# Patient Record
Sex: Male | Born: 1980
Health system: Southern US, Community
[De-identification: ages and names within clinical notes are randomized; demographics above are authoritative.]

---

## 2013-07-11 ENCOUNTER — Emergency Department (HOSPITAL_COMMUNITY): Payer: Worker's Compensation

## 2013-07-11 ENCOUNTER — Observation Stay (HOSPITAL_COMMUNITY)
Admission: EM | Admit: 2013-07-11 | Discharge: 2013-07-12 | Disposition: A | Discharge status: Home / Self Care | Payer: Worker's Compensation | Attending: General Surgery | Admitting: General Surgery

## 2013-07-11 ENCOUNTER — Encounter (HOSPITAL_COMMUNITY): Payer: Self-pay | Admitting: Emergency Medicine

## 2013-07-11 DIAGNOSIS — Z23 Encounter for immunization: Secondary | ICD-10-CM | POA: Insufficient documentation

## 2013-07-11 DIAGNOSIS — S060X1A Concussion with loss of consciousness of 30 minutes or less, initial encounter: Secondary | ICD-10-CM | POA: Diagnosis not present

## 2013-07-11 DIAGNOSIS — F17200 Nicotine dependence, unspecified, uncomplicated: Secondary | ICD-10-CM | POA: Insufficient documentation

## 2013-07-11 DIAGNOSIS — S060XAA Concussion with loss of consciousness status unknown, initial encounter: Secondary | ICD-10-CM

## 2013-07-11 DIAGNOSIS — S3282XA Multiple fractures of pelvis without disruption of pelvic ring, initial encounter for closed fracture: Secondary | ICD-10-CM | POA: Diagnosis not present

## 2013-07-11 DIAGNOSIS — S27329A Contusion of lung, unspecified, initial encounter: Secondary | ICD-10-CM | POA: Insufficient documentation

## 2013-07-11 DIAGNOSIS — Y92410 Unspecified street and highway as the place of occurrence of the external cause: Secondary | ICD-10-CM | POA: Insufficient documentation

## 2013-07-11 DIAGNOSIS — S329XXA Fracture of unspecified parts of lumbosacral spine and pelvis, initial encounter for closed fracture: Secondary | ICD-10-CM

## 2013-07-11 DIAGNOSIS — F1210 Cannabis abuse, uncomplicated: Secondary | ICD-10-CM | POA: Diagnosis not present

## 2013-07-11 DIAGNOSIS — S060X9A Concussion with loss of consciousness of unspecified duration, initial encounter: Secondary | ICD-10-CM

## 2013-07-11 LAB — COMPREHENSIVE METABOLIC PANEL
ALT: 68 U/L — AB (ref 0–53)
AST: 73 U/L — AB (ref 0–37)
Albumin: 3.8 g/dL (ref 3.5–5.2)
Alkaline Phosphatase: 63 U/L (ref 39–117)
Anion gap: 14 (ref 5–15)
BUN: 11 mg/dL (ref 6–23)
CALCIUM: 9.4 mg/dL (ref 8.4–10.5)
CO2: 22 meq/L (ref 19–32)
Chloride: 103 mEq/L (ref 96–112)
Creatinine, Ser: 1.01 mg/dL (ref 0.50–1.35)
GFR calc Af Amer: >90 mL/min (ref >90)
GFR calc non Af Amer: >90 mL/min (ref >90)
Glucose, Bld: 195 mg/dL — ABNORMAL HIGH (ref 70–99)
Potassium: 3.6 mEq/L — ABNORMAL LOW (ref 3.7–5.3)
SODIUM: 139 meq/L (ref 137–147)
TOTAL PROTEIN: 6.6 g/dL (ref 6.0–8.3)
Total Bilirubin: 0.4 mg/dL (ref 0.3–1.2)

## 2013-07-11 LAB — CDS SEROLOGY

## 2013-07-11 LAB — CBC
HEMATOCRIT: 41.8 % (ref 39.0–52.0)
Hemoglobin: 14.8 g/dL (ref 13.0–17.0)
MCH: 31.8 pg (ref 26.0–34.0)
MCHC: 35.4 g/dL (ref 30.0–36.0)
MCV: 89.7 fL (ref 78.0–100.0)
Platelets: 181 10*3/uL (ref 150–400)
RBC: 4.66 MIL/uL (ref 4.22–5.81)
RDW: 12.7 % (ref 11.5–15.5)
WBC: 23.6 10*3/uL — ABNORMAL HIGH (ref 4.0–10.5)

## 2013-07-11 LAB — PROTIME-INR
INR: 1.11 (ref 0.00–1.49)
Prothrombin Time: 14.3 seconds (ref 11.6–15.2)

## 2013-07-11 LAB — ETHANOL: Alcohol, Ethyl (B): <11 mg/dL (ref 0–11)

## 2013-07-11 LAB — SAMPLE TO BLOOD BANK

## 2013-07-11 MED ORDER — HYDROMORPHONE HCL PF 1 MG/ML IJ SOLN
1.0000 mg | Freq: Once | INTRAMUSCULAR | Status: AC
  Administered 2013-07-11: 1 mg via INTRAVENOUS

## 2013-07-11 MED ORDER — HYDROMORPHONE HCL PF 1 MG/ML IJ SOLN
INTRAMUSCULAR | Status: AC
  Filled 2013-07-11: qty 1

## 2013-07-11 MED ORDER — HYDROMORPHONE HCL PF 1 MG/ML IJ SOLN
1.0000 mg | Freq: Once | INTRAMUSCULAR | Status: AC
  Administered 2013-07-12: 1 mg via INTRAVENOUS
  Filled 2013-07-11: qty 1

## 2013-07-11 MED ORDER — ONDANSETRON HCL 4 MG/2ML IJ SOLN
4.0000 mg | Freq: Four times a day (QID) | INTRAMUSCULAR | Status: DC | PRN
  Administered 2013-07-12: 4 mg via INTRAVENOUS
  Filled 2013-07-11: qty 2

## 2013-07-11 MED ORDER — ACETAMINOPHEN 325 MG PO TABS: 650.0000 mg | ORAL_TABLET | ORAL | Status: DC | PRN

## 2013-07-11 MED ORDER — OXYCODONE HCL 5 MG PO TABS: 5.0000 mg | ORAL_TABLET | ORAL | Status: DC | PRN

## 2013-07-11 MED ORDER — IOHEXOL 300 MG/ML  SOLN
100.0000 mL | Freq: Once | INTRAMUSCULAR | Status: AC | PRN
  Administered 2013-07-11: 100 mL via INTRAVENOUS

## 2013-07-11 MED ORDER — HYDROMORPHONE HCL PF 1 MG/ML IJ SOLN
0.5000 mg | INTRAMUSCULAR | Status: DC | PRN
  Administered 2013-07-12: 1 mg via INTRAVENOUS
  Filled 2013-07-11: qty 1

## 2013-07-11 MED ORDER — OXYCODONE HCL 5 MG PO TABS: 10.0000 mg | ORAL_TABLET | ORAL | Status: DC | PRN

## 2013-07-11 MED ORDER — KCL IN DEXTROSE-NACL 20-5-0.45 MEQ/L-%-% IV SOLN
INTRAVENOUS | Status: DC
  Administered 2013-07-12: 03:00:00 via INTRAVENOUS
  Filled 2013-07-11: qty 1000

## 2013-07-11 MED ORDER — ONDANSETRON HCL 4 MG PO TABS: 4.0000 mg | ORAL_TABLET | Freq: Four times a day (QID) | ORAL | Status: DC | PRN

## 2013-07-11 MED ORDER — TETANUS-DIPHTH-ACELL PERTUSSIS 5-2.5-18.5 LF-MCG/0.5 IM SUSP
0.5000 mL | Freq: Once | INTRAMUSCULAR | Status: AC
  Administered 2013-07-11: 0.5 mL via INTRAMUSCULAR
  Filled 2013-07-11: qty 0.5
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
## 2013-07-12 DIAGNOSIS — S060XAA Concussion with loss of consciousness status unknown, initial encounter: Secondary | ICD-10-CM

## 2013-07-12 DIAGNOSIS — S060X9A Concussion with loss of consciousness of unspecified duration, initial encounter: Secondary | ICD-10-CM

## 2013-07-12 LAB — BASIC METABOLIC PANEL
Anion gap: 13 (ref 5–15)
BUN: 10 mg/dL (ref 6–23)
CALCIUM: 8.9 mg/dL (ref 8.4–10.5)
CO2: 24 mEq/L (ref 19–32)
Chloride: 101 mEq/L (ref 96–112)
Creatinine, Ser: 0.79 mg/dL (ref 0.50–1.35)
Glucose, Bld: 119 mg/dL — ABNORMAL HIGH (ref 70–99)
Potassium: 3.9 mEq/L (ref 3.7–5.3)
Sodium: 138 mEq/L (ref 137–147)

## 2013-07-12 LAB — URINALYSIS, ROUTINE W REFLEX MICROSCOPIC
BILIRUBIN URINE: NEGATIVE
Glucose, UA: 500 mg/dL — AB
Hgb urine dipstick: NEGATIVE
Ketones, ur: NEGATIVE mg/dL
Leukocytes, UA: NEGATIVE
NITRITE: NEGATIVE
Protein, ur: NEGATIVE mg/dL
Specific Gravity, Urine: 1.02 (ref 1.005–1.030)
UROBILINOGEN UA: 0.2 mg/dL (ref 0.0–1.0)
pH: 5.5 (ref 5.0–8.0)

## 2013-07-12 LAB — CBC
HCT: 40.9 % (ref 39.0–52.0)
Hemoglobin: 14.1 g/dL (ref 13.0–17.0)
MCH: 31.3 pg (ref 26.0–34.0)
MCHC: 34.5 g/dL (ref 30.0–36.0)
MCV: 90.7 fL (ref 78.0–100.0)
Platelets: 131 10*3/uL — ABNORMAL LOW (ref 150–400)
RBC: 4.51 MIL/uL (ref 4.22–5.81)
RDW: 12.8 % (ref 11.5–15.5)
WBC: 10.7 10*3/uL — ABNORMAL HIGH (ref 4.0–10.5)

## 2013-07-12 MED ORDER — NAPROXEN 500 MG PO TABS
500.0000 mg | ORAL_TABLET | Freq: Two times a day (BID) | ORAL | Status: DC
  Administered 2013-07-12: 500 mg via ORAL
  Filled 2013-07-12 (×3): qty 1

## 2013-07-12 MED ORDER — NAPROXEN 500 MG PO TABS: 500.0000 mg | ORAL_TABLET | Freq: Two times a day (BID) | ORAL | Status: AC

## 2013-07-12 MED ORDER — HYDROMORPHONE HCL PF 1 MG/ML IJ SOLN: 0.5000 mg | INTRAMUSCULAR | Status: DC | PRN

## 2013-07-12 MED ORDER — ENOXAPARIN SODIUM 30 MG/0.3ML ~~LOC~~ SOLN
30.0000 mg | Freq: Two times a day (BID) | SUBCUTANEOUS | Status: DC
  Administered 2013-07-12: 30 mg via SUBCUTANEOUS
  Filled 2013-07-12 (×2): qty 0.3

## 2013-07-12 MED ORDER — OXYCODONE HCL 5 MG PO TABS
5.0000 mg | ORAL_TABLET | ORAL | Status: DC | PRN
  Administered 2013-07-12: 15 mg via ORAL
  Filled 2013-07-12: qty 3

## 2013-07-12 MED ORDER — OXYCODONE-ACETAMINOPHEN 5-325 MG PO TABS: 1.0000 | ORAL_TABLET | ORAL | Status: AC | PRN

## 2013-07-12 NOTE — Progress Notes (Signed)
UR Completed Nadiah Corbit Graves-Bigelow, RN,BSN [phone]  
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
## 2013-07-12 NOTE — Progress Notes (Signed)
Appreciate ortho eval. Advance diet and begin therapies. Patient examined and I agree with the assessment and plan  Violeta GelinasBurke Mitsugi Schrader, MD, MPH, FACS Trauma: [phone] General Surgery: [phone]  07/12/2013 9:15 AM

## 2013-07-12 NOTE — Discharge Summary (Signed)
Brindle Leyba, MD, MPH, FACS Trauma: [phone] General Surgery: [phone]  
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
## 2013-07-12 NOTE — Evaluation (Signed)
I have read and agree with this note.   Time in/out:1442-1505 Total time: 23 minutes (ev, 1SC)  Ignacia Palmaathy Seymore Brodowski, OTR/L [phone]
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
## 2013-07-12 NOTE — Progress Notes (Addendum)
Physical medicine and rehabilitation consult requested with chart reviewed. Spoke at length with patient and mother at bedside. Patient and mother both requesting discharge to home with home health therapies. Mother is taking FMLA to provide assistance. Hold on formal rehabilitation consult at this time with request discharge to home   Agree with above.  Ranelle OysterZachary T. Swartz, MD, [facility]APMR [facility]
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
## 2013-07-12 NOTE — Discharge Instructions (Signed)
Do not put weight down on your right leg.  No driving while taking oxycodone.
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
